# Patient Record
Sex: Male | Born: 1955 | Race: White | Hispanic: Yes | Marital: Married | Smoking: Never smoker
Health system: Southern US, Community
[De-identification: ages and names within clinical notes are randomized; demographics above are authoritative.]

---

## 2018-11-02 ENCOUNTER — Other Ambulatory Visit: Payer: Self-pay

## 2018-11-02 ENCOUNTER — Emergency Department (HOSPITAL_BASED_OUTPATIENT_CLINIC_OR_DEPARTMENT_OTHER): Payer: Self-pay

## 2018-11-02 ENCOUNTER — Encounter (HOSPITAL_BASED_OUTPATIENT_CLINIC_OR_DEPARTMENT_OTHER): Payer: Self-pay | Admitting: Emergency Medicine

## 2018-11-02 ENCOUNTER — Emergency Department (HOSPITAL_BASED_OUTPATIENT_CLINIC_OR_DEPARTMENT_OTHER)
Admission: EM | Admit: 2018-11-02 | Discharge: 2018-11-02 | Disposition: A | Discharge status: Home / Self Care | Payer: Self-pay | Attending: Emergency Medicine | Admitting: Emergency Medicine

## 2018-11-02 DIAGNOSIS — S68111A Complete traumatic metacarpophalangeal amputation of left index finger, initial encounter: Secondary | ICD-10-CM | POA: Insufficient documentation

## 2018-11-02 DIAGNOSIS — Z23 Encounter for immunization: Secondary | ICD-10-CM | POA: Insufficient documentation

## 2018-11-02 DIAGNOSIS — Z20828 Contact with and (suspected) exposure to other viral communicable diseases: Secondary | ICD-10-CM | POA: Insufficient documentation

## 2018-11-02 DIAGNOSIS — W312XXA Contact with powered woodworking and forming machines, initial encounter: Secondary | ICD-10-CM

## 2018-11-02 DIAGNOSIS — S6982XA Other specified injuries of left wrist, hand and finger(s), initial encounter: Secondary | ICD-10-CM

## 2018-11-02 DIAGNOSIS — S68119A Complete traumatic metacarpophalangeal amputation of unspecified finger, initial encounter: Secondary | ICD-10-CM

## 2018-11-02 DIAGNOSIS — Y939 Activity, unspecified: Secondary | ICD-10-CM | POA: Insufficient documentation

## 2018-11-02 DIAGNOSIS — Y929 Unspecified place or not applicable: Secondary | ICD-10-CM | POA: Insufficient documentation

## 2018-11-02 DIAGNOSIS — W270XXA Contact with workbench tool, initial encounter: Secondary | ICD-10-CM | POA: Insufficient documentation

## 2018-11-02 DIAGNOSIS — Y999 Unspecified external cause status: Secondary | ICD-10-CM | POA: Insufficient documentation

## 2018-11-02 LAB — CBC WITH DIFFERENTIAL/PLATELET
Abs Immature Granulocytes: 0.03 K/uL (ref 0.00–0.07)
Basophils Absolute: 0 K/uL (ref 0.0–0.1)
Basophils Relative: 1 %
Eosinophils Absolute: 0.1 K/uL (ref 0.0–0.5)
Eosinophils Relative: 2 %
HCT: 47.6 % (ref 39.0–52.0)
Hemoglobin: 15.8 g/dL (ref 13.0–17.0)
Immature Granulocytes: 0 %
Lymphocytes Relative: 25 %
Lymphs Abs: 1.7 K/uL (ref 0.7–4.0)
MCH: 27.9 pg (ref 26.0–34.0)
MCHC: 33.2 g/dL (ref 30.0–36.0)
MCV: 84.1 fL (ref 80.0–100.0)
Monocytes Absolute: 0.6 K/uL (ref 0.1–1.0)
Monocytes Relative: 8 %
Neutro Abs: 4.3 K/uL (ref 1.7–7.7)
Neutrophils Relative %: 64 %
Platelets: 436 K/uL — ABNORMAL HIGH (ref 150–400)
RBC: 5.66 MIL/uL (ref 4.22–5.81)
RDW: 12.6 % (ref 11.5–15.5)
WBC: 6.7 K/uL (ref 4.0–10.5)
nRBC: 0 % (ref 0.0–0.2)

## 2018-11-02 LAB — BASIC METABOLIC PANEL WITH GFR
Anion gap: 8 (ref 5–15)
BUN: 18 mg/dL (ref 8–23)
CO2: 24 mmol/L (ref 22–32)
Calcium: 8.9 mg/dL (ref 8.9–10.3)
Chloride: 104 mmol/L (ref 98–111)
Creatinine, Ser: 0.91 mg/dL (ref 0.61–1.24)
GFR calc Af Amer: >60 mL/min
GFR calc non Af Amer: >60 mL/min
Glucose, Bld: 108 mg/dL — ABNORMAL HIGH (ref 70–99)
Potassium: 4.4 mmol/L (ref 3.5–5.1)
Sodium: 136 mmol/L (ref 135–145)

## 2018-11-02 LAB — SARS CORONAVIRUS 2 BY RT PCR (HOSPITAL ORDER, PERFORMED IN ~~LOC~~ HOSPITAL LAB): SARS Coronavirus 2: NEGATIVE

## 2018-11-02 MED ORDER — AMOXICILLIN-POT CLAVULANATE 875-125 MG PO TABS: 1.0000 | ORAL_TABLET | Freq: Two times a day (BID) | ORAL | 0 refills | Status: DC

## 2018-11-02 MED ORDER — TETANUS-DIPHTH-ACELL PERTUSSIS 5-2.5-18.5 LF-MCG/0.5 IM SUSP
0.5000 mL | Freq: Once | INTRAMUSCULAR | Status: AC
  Administered 2018-11-02: 0.5 mL via INTRAMUSCULAR
  Filled 2018-11-02: qty 0.5

## 2018-11-02 MED ORDER — CEFAZOLIN SODIUM-DEXTROSE 1-4 GM/50ML-% IV SOLN
1.0000 g | Freq: Once | INTRAVENOUS | Status: AC
  Administered 2018-11-02: 17:00:00 1 g via INTRAVENOUS
  Filled 2018-11-02: qty 50

## 2018-11-02 MED ORDER — OXYCODONE-ACETAMINOPHEN 5-325 MG PO TABS: 1.0000 | ORAL_TABLET | Freq: Four times a day (QID) | ORAL | 0 refills | Status: DC | PRN

## 2018-11-02 MED ORDER — LIDOCAINE HCL (PF) 1 % IJ SOLN
30.0000 mL | Freq: Once | INTRAMUSCULAR | Status: DC
  Filled 2018-11-02: qty 30

## 2018-11-02 MED ORDER — MORPHINE SULFATE (PF) 4 MG/ML IV SOLN
4.0000 mg | Freq: Once | INTRAVENOUS | Status: AC
  Administered 2018-11-02: 4 mg via INTRAVENOUS
  Filled 2018-11-02: qty 1

## 2018-11-02 NOTE — ED Provider Notes (Signed)
[facility] EMERGENCY DEPARTMENT Provider Note   CSN: [id number] Arrival date & time: 11/02/18  1627     History   Chief Complaint Chief Complaint  Patient presents with  . Extremity Laceration    HPI Benjamin Harper is a 63 y.o. male who is previously healthy who presents with fingertip amputation from an electric saw to his left index finger.  Patient wrapped it with a Band-Aid.  His tetanus is not up-to-date.  Patient has no sensation above the middle phalanx and the amputated has turned white.  Patient denies any other injuries or complaints.  Patient last ate at around lunchtime today.     HPI  History reviewed. No pertinent past medical history.  There are no active problems to display for this patient.   History reviewed. No pertinent surgical history.      Home Medications    Prior to Admission medications   Medication Sig Start Date End Date Taking? Authorizing Provider  amoxicillin-clavulanate (AUGMENTIN) 875-125 MG tablet Take 1 tablet by mouth every 12 (twelve) hours. 11/02/18   Frederica Kuster, PA-C  oxyCODONE-acetaminophen (PERCOCET/ROXICET) 5-325 MG tablet Take 1-2 tablets by mouth every 6 (six) hours as needed for severe pain. 11/02/18   Frederica Kuster, PA-C    Family History No family history on file.  Social History Social History   Tobacco Use  . Smoking status: Never Smoker  . Smokeless tobacco: Never Used  Substance Use Topics  . Alcohol use: Yes    Comment: "little bit every day"  . Drug use: Never     Allergies   Patient has no known allergies.   Review of Systems Review of Systems  Constitutional: Negative for chills and fever.  HENT: Negative for facial swelling and sore throat.   Respiratory: Negative for shortness of breath.   Cardiovascular: Negative for chest pain.  Gastrointestinal: Negative for abdominal pain, nausea and vomiting.  Genitourinary: Negative for dysuria.  Musculoskeletal: Negative for back pain.   Skin: Positive for color change and wound. Negative for rash.  Neurological: Positive for numbness. Negative for headaches.  Psychiatric/Behavioral: The patient is not nervous/anxious.      Physical Exam Updated Vital Signs BP (!) 169/82 (BP Location: Right Arm)   Pulse 72   Temp 97.8 F (36.6 C)   Resp 16   Ht 5\' 5"  (1.651 m)   Wt 73.3 kg   SpO2 100%   BMI 26.88 kg/m   Physical Exam Vitals signs and nursing note reviewed.  Constitutional:      General: He is not in acute distress.    Appearance: He is well-developed. He is not diaphoretic.  HENT:     Head: Normocephalic and atraumatic.     Mouth/Throat:     Pharynx: No oropharyngeal exudate.  Eyes:     General: No scleral icterus.       Right eye: No discharge.        Left eye: No discharge.     Conjunctiva/sclera: Conjunctivae normal.     Pupils: Pupils are equal, round, and reactive to light.  Neck:     Musculoskeletal: Normal range of motion and neck supple.     Thyroid: No thyromegaly.  Cardiovascular:     Rate and Rhythm: Normal rate and regular rhythm.     Heart sounds: Normal heart sounds. No murmur. No friction rub. No gallop.   Pulmonary:     Effort: Pulmonary effort is normal. No respiratory distress.     Breath  sounds: Normal breath sounds. No stridor. No wheezing or rales.  Abdominal:     General: Bowel sounds are normal. There is no distension.     Palpations: Abdomen is soft.     Tenderness: There is no abdominal tenderness. There is no guarding or rebound.  Musculoskeletal:     Comments: Left index amputated just above the DIP through the nail; no attached piece; amputated pieces of white and cool to touch, no cap refill or sensation; full range of motion to the digit  Lymphadenopathy:     Cervical: No cervical adenopathy.  Skin:    General: Skin is warm and dry.     Coloration: Skin is not pale.     Findings: No rash.  Neurological:     Mental Status: He is alert.     Coordination:  Coordination normal.          ED Treatments / Results  Labs (all labs ordered are listed, but only abnormal results are displayed) Labs Reviewed  BASIC METABOLIC PANEL - Abnormal; Notable for the following components:      Result Value   Glucose, Bld 108 (*)    All other components within normal limits  CBC WITH DIFFERENTIAL/PLATELET - Abnormal; Notable for the following components:   Platelets 436 (*)    All other components within normal limits  SARS CORONAVIRUS 2 BY RT PCR (HOSPITAL ORDER, PERFORMED IN [facility])    EKG None  Radiology Dg Finger Index Left  Result Date: 11/02/2018 CLINICAL DATA:  Laceration to the left index finger. EXAM: LEFT INDEX FINGER 2+V COMPARISON:  None. FINDINGS: There is a comminuted fracture of the distal aspect of the distal phalanx of left index finger. No visible radiodense foreign bodies. Bone fragments in the soft tissues. IMPRESSION: Comminuted fracture of the distal aspect of the distal phalanx of the left index finger. No radiodense foreign bodies. Electronically Signed   By: Francene Boyers M.D.   On: 11/02/2018 17:15    Procedures Procedures (including critical care time)  Medications Ordered in ED Medications  lidocaine (PF) (XYLOCAINE) 1 % injection 30 mL (has no administration in time range)  Tdap (BOOSTRIX) injection 0.5 mL (0.5 mLs Intramuscular Given 11/02/18 1723)  morphine 4 MG/ML injection 4 mg (4 mg Intravenous Given 11/02/18 1719)  ceFAZolin (ANCEF) IVPB 1 g/50 mL premix (0 g Intravenous Stopped 11/02/18 1757)     Initial Impression / Assessment and Plan / ED Course  I have reviewed the triage vital signs and the nursing notes.  Pertinent labs & imaging results that were available during my care of the patient were reviewed by me and considered in my medical decision making (see chart for details).        Patient presenting with left index finger amputation from salt.  He has a comminuted fracture  of the distal phalanx.  The amputated portion is without cap refill no sensation.  Hand surgeon, Dr. Arita Miss, repaired injury at bedside, see his note for further details.  Tetanus updated.  Ancef given.  Will discharge home with Percocet for pain and Augmentin.  Follow-up with Dr. Arita Miss in 1 week.  Return precautions including infection discussed.  Patient understands and agrees with plan.  Patient vital stable throughout ED course, except for elevated blood pressure, suspect due to pain.  Patient discharged in satisfactory condition.  Final Clinical Impressions(s) / ED Diagnoses   Final diagnoses:  Amputation of tip of finger, initial encounter    ED Discharge  Orders         Ordered    amoxicillin-clavulanate (AUGMENTIN) 875-125 MG tablet  Every 12 hours     11/02/18 1940    oxyCODONE-acetaminophen (PERCOCET/ROXICET) 5-325 MG tablet  Every 6 hours PRN     11/02/18 1940           Emi HolesLaw, Regnald Bowens M, PA-C 11/02/18 2053    Maia PlanLong, Joshua G, MD 11/03/18 1354

## 2018-11-02 NOTE — Consult Note (Signed)
Referring Provider No referring provider defined for this encounter.   CC:  Chief Complaint  Patient presents with  . Extremity Laceration  Left index finger amputation.  Benjamin Harper is an 63 y.o. male.  HPI: Patient presents with a saw injury to the left index fingertip.  This happened earlier today.  It is a volar oblique amputation starting at the very distal nailbed and extending along the pulp.  He has pain in the left index finger.  He reports no other injuries.  He reports no history of trauma to that finger.  No Known Allergies  History reviewed. No pertinent past medical history.  History reviewed. No pertinent surgical history.  No family history on file.  Social History   Social History Narrative  . Not on file     Review of Systems General: Denies fevers, chills, weight loss CV: Denies chest pain, shortness of breath, palpitations  Physical Exam Vitals with BMI 11/02/2018 11/02/2018 11/02/2018  Height - - 5\' 5"   Weight - - 161 lbs 8 oz  BMI - - 26.88  Systolic 169 159 -  Diastolic 82 90 -  Pulse 72 78 -    General:  No acute distress,  Alert and oriented, Non-Toxic, Normal speech and affect Focused exam the left hand: Fingers with the exception of the index are well perfused with normal capillary refill and a palpable radial pulse.  The amputated portion of the index finger has been completely detached.  The laceration starts at the very distal nail bed and extends proximal proximally and ends distal to the DIP flexion crease.  The remainder of the index finger is well-perfused and sensate.  He has good active range of motion including flexion extension of the index finger DIP joint.  There is no other signs of trauma to the hand.  Imaging was reviewed and shows a distal tuft fracture of the distal phalanx.  No other bony injury  Assessment/Plan Patient presents with a saw injury and amputation to the left index fingertip.  The amputated portion consist  primarily of the volar soft tissues of the pulp.  There is still soft tissue over the distal phalanx volarly.  I explained the various options to the patient through his interpreter.  The options included using the amputated part as a composite graft.  Additionally we discussed cross finger flaps and thenar flaps for durable soft tissue coverage of the fingertip.  I explained all these options were geared towards preserving length of the finger and avoiding him needing a more proximal amputation.  After discussion he elected, and I agreed with placing the amputated portion as a composite graft.  I expect this will give him enough soft tissue to have a functional index finger.  If it were to fail and he would be unhappy with his function the flap options are still available.  I discussed the risks with him and the potential for partial total graft loss.  He is fully understanding.  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  The graft including skin dermis and adipose tissue.  SURGEON: 11/04/2018, MD  ANESTHESIA: Local.  COMPLICATIONS: None.   CONSENT:  Informed consent was obtained directly from the patient. Risks, benefits and alternatives were fully discussed. Specific risks including but not limited to  bleeding, hematoma, scarring, pain, infection, contracture,wound healing problems, and need for further surgery were all discussed. The patient did have an ample opportunity to have questions answered to satisfaction.   DESCRIPTION OF PROCEDURE:  A digital block was performed with 1% lidocaine plain.  The amputated part was placed in Betadine saline solution.  The left hand was prepped with Betadine and a sterile field was set up.  The remaining nail  plate was removed from both the amputated part in the finger.  The graft was then placed and secured with 4-0 chromic sutures.  The overall size of the graft was about 2-1/2 x 1-1/2 cm.  The nailbed portion was repaired with 5-0 Vicryl.  Xeroform 4 x 4's Kerlix were then applied followed by Coban.  The patient tolerated the procedure well.  There were no complications.  He will follow up with me in 1 week.  Instructed in the leaving the to leave the dressing in place.  Emergency room will give him antibiotics and pain medication.   Cindra Presume 11/02/2018, 9:07 PM

## 2018-11-02 NOTE — ED Notes (Signed)
Pt left pointer finger cut by saw, can feel touch to lower portion but states no pain past middle phalanx or above, white, cool to touch. Bleeding controlled. A&O x4, spanish speaking only, visitor at bedside to translate.

## 2018-11-02 NOTE — ED Triage Notes (Signed)
Lac to left index finger with a saw while cutting wood.

## 2018-11-11 ENCOUNTER — Other Ambulatory Visit: Payer: Self-pay

## 2018-11-11 ENCOUNTER — Ambulatory Visit (INDEPENDENT_AMBULATORY_CARE_PROVIDER_SITE_OTHER): Payer: Self-pay | Admitting: Plastic Surgery

## 2018-11-11 ENCOUNTER — Encounter: Payer: Self-pay | Admitting: Plastic Surgery

## 2018-11-11 VITALS — BP 163/84 | HR 90 | Temp 97.5°F | Ht 65.0 in | Wt 162.8 lb

## 2018-11-11 DIAGNOSIS — S6992XD Unspecified injury of left wrist, hand and finger(s), subsequent encounter: Secondary | ICD-10-CM

## 2018-11-11 NOTE — Progress Notes (Signed)
Patient is doing well after a volar oblique amputation of the index finger.  This was sewn back on as a composite graft.  He is doing well and says he only has some mild to moderate pain at this point.  He has kept the wound protected.  On exam it looks like portions of the graft are incorporating.  There is no signs of infection.  I think overall he should have a reasonable result from this.  We will plan to keep this protected.  He can change the dressing and clean it daily.  I will plan to see him back in 2 weeks.

## 2018-11-25 ENCOUNTER — Encounter: Payer: Self-pay | Admitting: Plastic Surgery

## 2018-11-25 ENCOUNTER — Ambulatory Visit (INDEPENDENT_AMBULATORY_CARE_PROVIDER_SITE_OTHER): Payer: Self-pay | Admitting: Plastic Surgery

## 2018-11-25 ENCOUNTER — Other Ambulatory Visit: Payer: Self-pay

## 2018-11-25 VITALS — BP 159/84 | HR 90 | Temp 97.7°F | Ht 65.0 in | Wt 167.2 lb

## 2018-11-25 DIAGNOSIS — S6992XD Unspecified injury of left wrist, hand and finger(s), subsequent encounter: Secondary | ICD-10-CM

## 2018-11-25 NOTE — Progress Notes (Signed)
Patient is here for follow-up after his volar oblique index finger amputation.  The amputated part was sewn back on as a composite graft.  His pain is improving and has had no fevers.  On exam the distal portion of the graft appears to be necrosis at least superficially.  Some of the surrounding more proximal areas look to have better perfusion.  There is no drainage fluctuance or pain along the tendon sheath.  I think this will ultimately be okay for him and at least as good as a regional flap.  I explained we give this time to demarcate and as the scabbed portion falls off he should have a healed bed beneath it.  I did explain that this would take quite a bit of time.  I think it is fine for him to start doing some work with that hand as long as he is careful and keeps the finger wrapped up.  I am going to see him again in 3 weeks.

## 2018-12-16 ENCOUNTER — Encounter: Payer: Self-pay | Admitting: Plastic Surgery

## 2018-12-16 ENCOUNTER — Ambulatory Visit (INDEPENDENT_AMBULATORY_CARE_PROVIDER_SITE_OTHER): Payer: Self-pay | Admitting: Plastic Surgery

## 2018-12-16 ENCOUNTER — Other Ambulatory Visit: Payer: Self-pay

## 2018-12-16 VITALS — BP 157/88 | HR 79 | Temp 97.7°F | Ht 65.0 in | Wt 167.8 lb

## 2018-12-16 DIAGNOSIS — S6992XD Unspecified injury of left wrist, hand and finger(s), subsequent encounter: Secondary | ICD-10-CM

## 2018-12-16 NOTE — Progress Notes (Signed)
Patient presents postop from an index finger volar oblique amputation.  I reapplied the amputated part as a composite positive graft.  He feels like his pain is going down and he has been wearing a splint to protect the finger while working but otherwise keeps it uncovered.  On exam the composite graft has turned black superficially and this is starting to lift off from the sides.  I anticipate he will have a functional index finger with enough padding once this lifts off.  He does have a little bit of stiffness in the PIP joint and I have asked him to work on range of motion for that and he can use his other hand to help achieve better flexion.  I will plan to see him again in a month.

## 2019-01-20 ENCOUNTER — Ambulatory Visit: Payer: Self-pay | Admitting: Plastic Surgery

## 2019-01-26 ENCOUNTER — Ambulatory Visit: Payer: Self-pay | Admitting: Plastic Surgery

## 2020-04-18 IMAGING — CR DG FINGER INDEX 2+V*L*
3 series · 3 of 3 positions shown · non-contrast
Comparison: None.

CLINICAL DATA: Laceration to the left index finger.

EXAM:
LEFT INDEX FINGER 2+V

[x finger pa left]
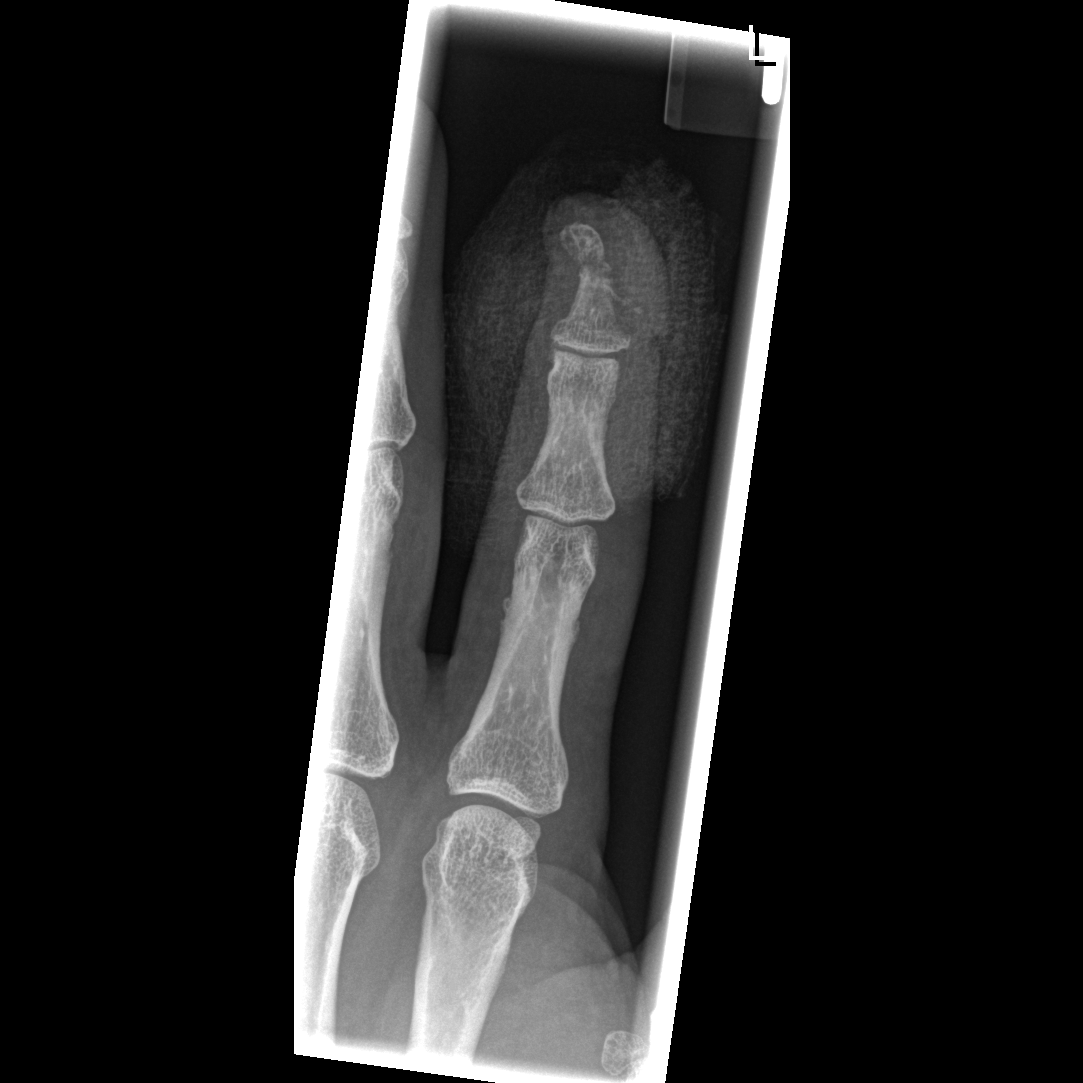

[x finger obl. left]
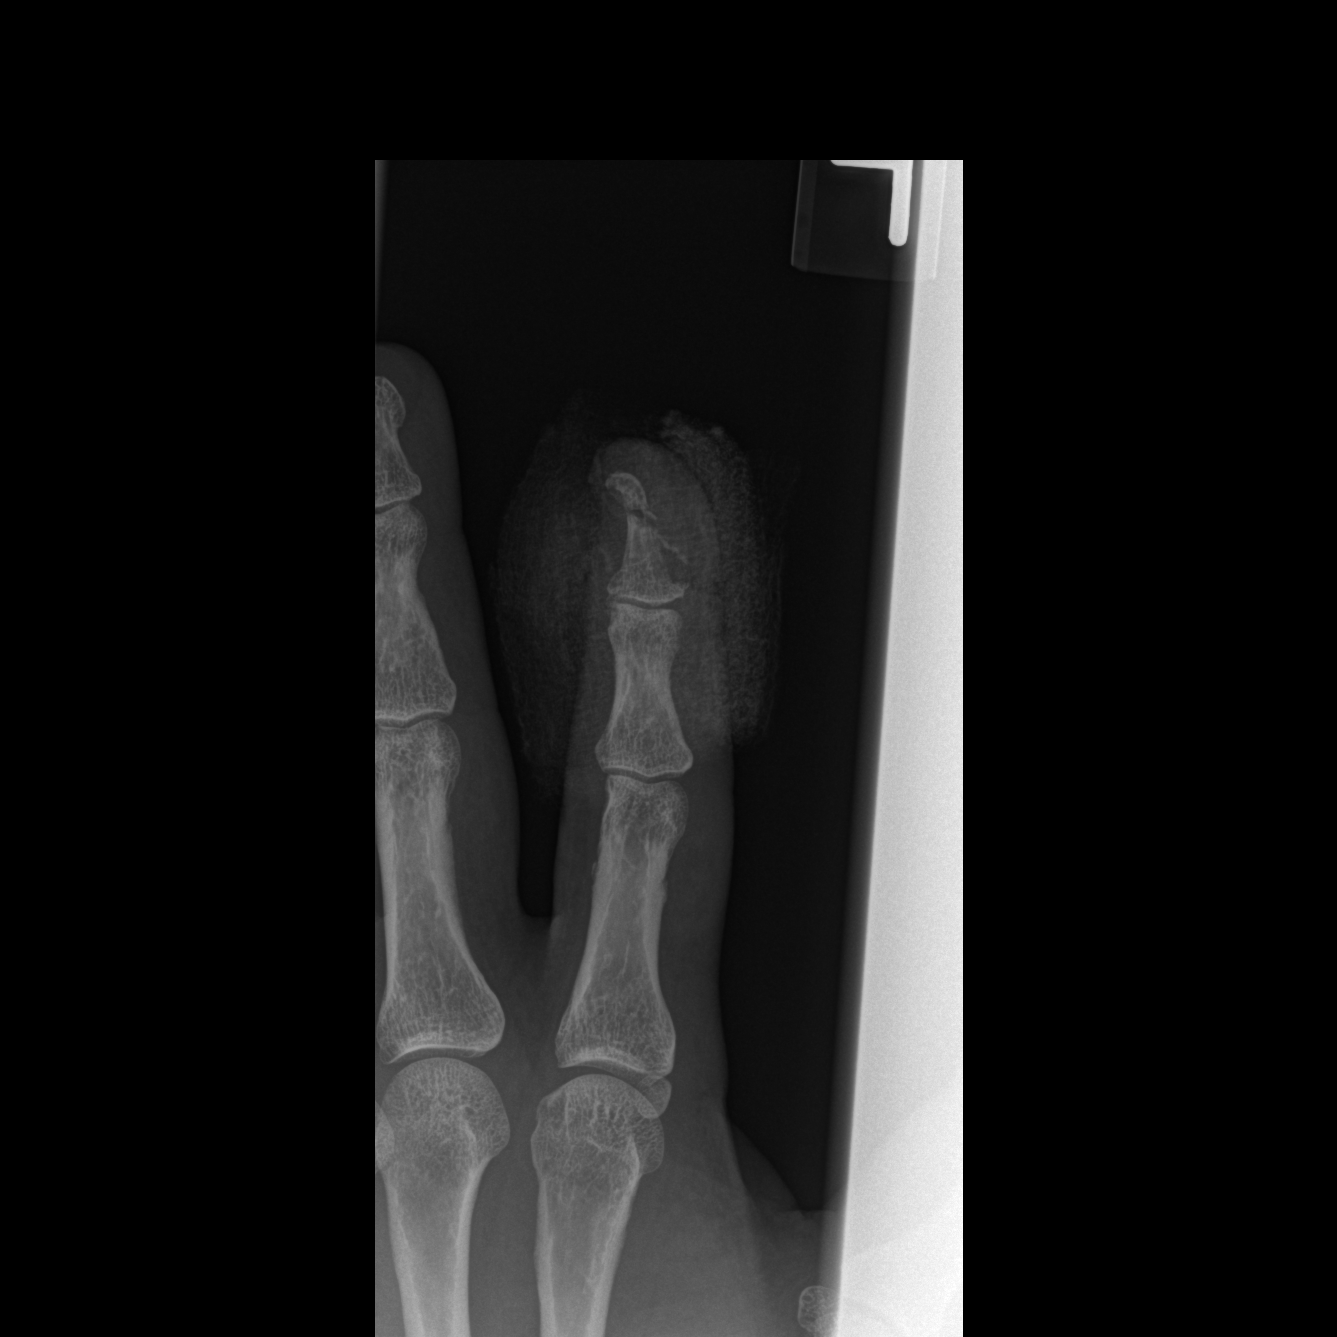

[x finger lateral left]
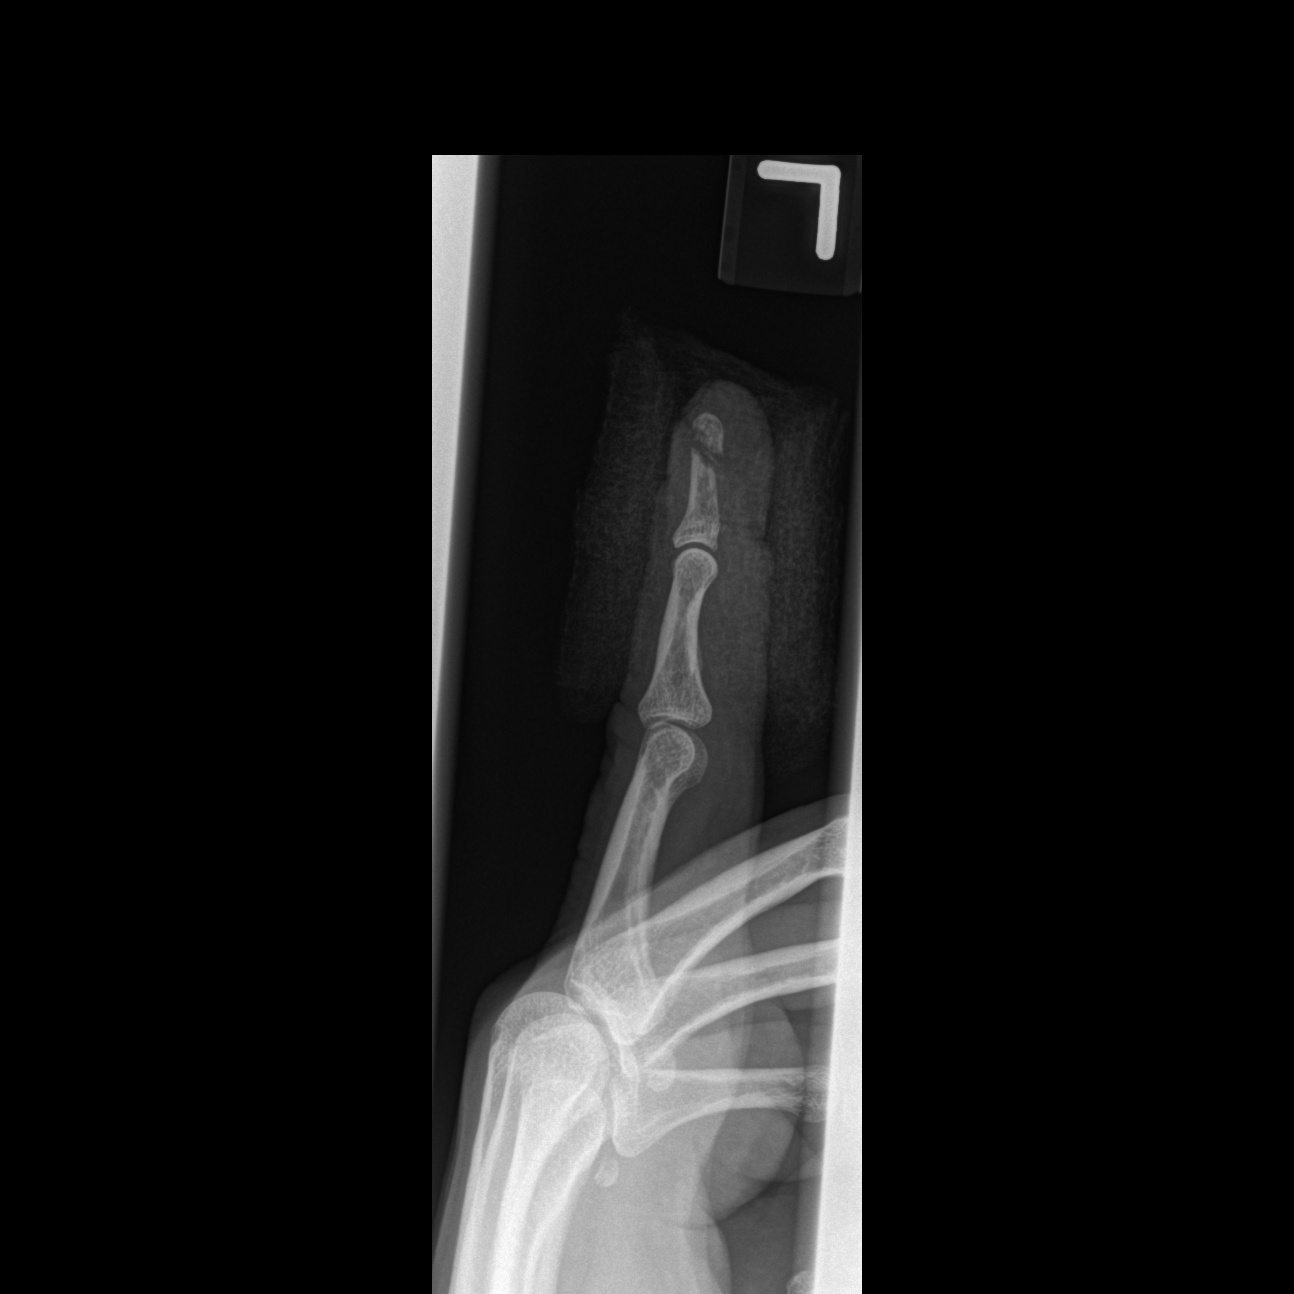

[3 of 3 positions shown; findings below may reference images not displayed]

FINDINGS: There is a comminuted fracture of the distal aspect of the distal
phalanx of left index finger. No visible radiodense foreign bodies.
Bone fragments in the soft tissues.
IMPRESSION: Comminuted fracture of the distal aspect of the distal phalanx of
the left index finger. No radiodense foreign bodies.
